# Patient Record
Sex: Male | Born: 1995 | Race: White | Hispanic: No | Marital: Single | Smoking: Never smoker
Health system: Southern US, Community
[De-identification: ages and names within clinical notes are randomized; demographics above are authoritative.]

## PROBLEM LIST (undated history)

## (undated) ENCOUNTER — Ambulatory Visit (HOSPITAL_COMMUNITY): Discharge status: Absent without leave | Payer: Self-pay

## (undated) HISTORY — PX: NO PAST SURGERIES: SHX2092

---

## 2015-12-02 ENCOUNTER — Encounter: Payer: Self-pay | Admitting: *Deleted

## 2015-12-02 ENCOUNTER — Ambulatory Visit (INDEPENDENT_AMBULATORY_CARE_PROVIDER_SITE_OTHER): Payer: Worker's Compensation

## 2015-12-02 ENCOUNTER — Ambulatory Visit
Admission: EM | Admit: 2015-12-02 | Discharge: 2015-12-02 | Disposition: A | Discharge status: Home / Self Care | Payer: Worker's Compensation | Attending: Family Medicine | Admitting: Family Medicine

## 2015-12-02 DIAGNOSIS — S100XXA Contusion of throat, initial encounter: Secondary | ICD-10-CM | POA: Diagnosis not present

## 2015-12-02 NOTE — ED Notes (Signed)
While at work pt was struck with a metal grid to his adam's apple / anterior neck. Pt vomited 3x following incident but denies dyspnea at anytime since injury. No obvious edema/deformity to area at present.

## 2015-12-02 NOTE — Discharge Instructions (Signed)

## 2015-12-02 NOTE — ED Provider Notes (Signed)
CSN: [id number]     Arrival date & time 12/02/15  1736 History   First MD Initiated Contact with Patient 12/02/15 1802     Chief Complaint  Patient presents with  . Neck Injury   (Consider location/radiation/quality/duration/timing/severity/associated sxs/prior Treatment) HPI  20 year old male who presents with into her throat pain after he was struck at work by metal grid Striking the right anterior larynx. Proxy 15 minutes later he states that he vomited 3 times area is feeling better at the present time said no bleeding. And no further vomiting. He has had no change in his voice and denies any respiratory changes or difficulty. O2 sats on room air 99%.      History reviewed. No pertinent past medical history. History reviewed. No pertinent past surgical history. Family History  Problem Relation Age of Onset  . Healthy Mother   . Healthy Father    Social History  Substance Use Topics  . Smoking status: Never Smoker   . Smokeless tobacco: None  . Alcohol Use: Yes    Review of Systems  Constitutional: Negative for fever, chills, activity change and fatigue.  Gastrointestinal: Positive for nausea and vomiting.  All other systems reviewed and are negative.   Allergies  Review of patient's allergies indicates no known allergies.  Home Medications   Prior to Admission medications   Not on File   Meds Ordered and Administered this Visit  Medications - No data to display  BP 110/66 mmHg  Pulse 56  Temp(Src) 98.4 F (36.9 C) (Oral)  Resp 16  Ht 5\' 11"  (1.803 m)  Wt 150 lb (68.04 kg)  BMI 20.93 kg/m2  SpO2 99% No data found.   Physical Exam  Constitutional: He is oriented to person, place, and time. He appears well-developed and well-nourished. No distress.  HENT:  Head: Normocephalic and atraumatic.  Eyes: Conjunctivae and EOM are normal. Pupils are equal, round, and reactive to light. Right eye exhibits no discharge. Left eye exhibits no discharge.  Neck:  Normal range of motion. Neck supple.  Examination of the cervical spine shows good range of motion that is comfortable. There is mild tenderness to palpation of the right lateral pharynx. He has a excellent inspiration and expiration with good air movement. There is no stridor. Is able to swallow without difficulty. The voice does not sound hoarse or muffled.  Pulmonary/Chest: Effort normal and breath sounds normal. No respiratory distress. He has no wheezes. He has no rales.  Musculoskeletal: Normal range of motion.  Lymphadenopathy:    He has no cervical adenopathy.  Neurological: He is alert and oriented to person, place, and time.  Skin: Skin is warm and dry. He is not diaphoretic.  Psychiatric: He has a normal mood and affect. His behavior is normal. Judgment and thought content normal.  Nursing note and vitals reviewed.   ED Course  Procedures (including critical care time)  Labs Review Labs Reviewed - No data to display  Imaging Review Dg Neck Soft Tissue  12/02/2015  CLINICAL DATA:  Patient struck with metal grid to anterior neck at work today. Vomiting 3 times after incident. Mild pain on swallowing. EXAM: NECK SOFT TISSUES - 1+ VIEW COMPARISON:  None. FINDINGS: There is no evidence of retropharyngeal soft tissue swelling or epiglottic enlargement. The cervical airway is unremarkable and no radio-opaque foreign body identified. IMPRESSION: Negative. Electronically Signed   By: Elberta Fortisaniel  Boyle M.D.   On: 12/02/2015 18:46     Visual Acuity Review  Right Eye  Distance:   Left Eye Distance:   Bilateral Distance:    Right Eye Near:   Left Eye Near:    Bilateral Near:         MDM   1. Contusion, throat, initial encounter    Have told patient that his x-rays were negative for any injury to the larynx by soft tissue study. I have recommended that he return to work tomorrow. He should have no restrictions. Over-the-counter ibuprofen if he is having discomfort. He develops any   Stridor, difficulty breathing or continued pain he should follow-up with [facility]. He may return of course to the clinic or go to emergency department.    Lutricia FeilWilliam P Zong Mcquarrie, PA-C 12/02/15 1907

## 2017-12-19 IMAGING — CR DG NECK SOFT TISSUE
2 series · 2 of 2 positions shown · non-contrast
Comparison: None.

CLINICAL DATA: Patient struck with metal grid to anterior neck at
work today. Vomiting 3 times after incident. Mild pain on
swallowing.

EXAM:
NECK SOFT TISSUES - 1+ VIEW

[neck lat]
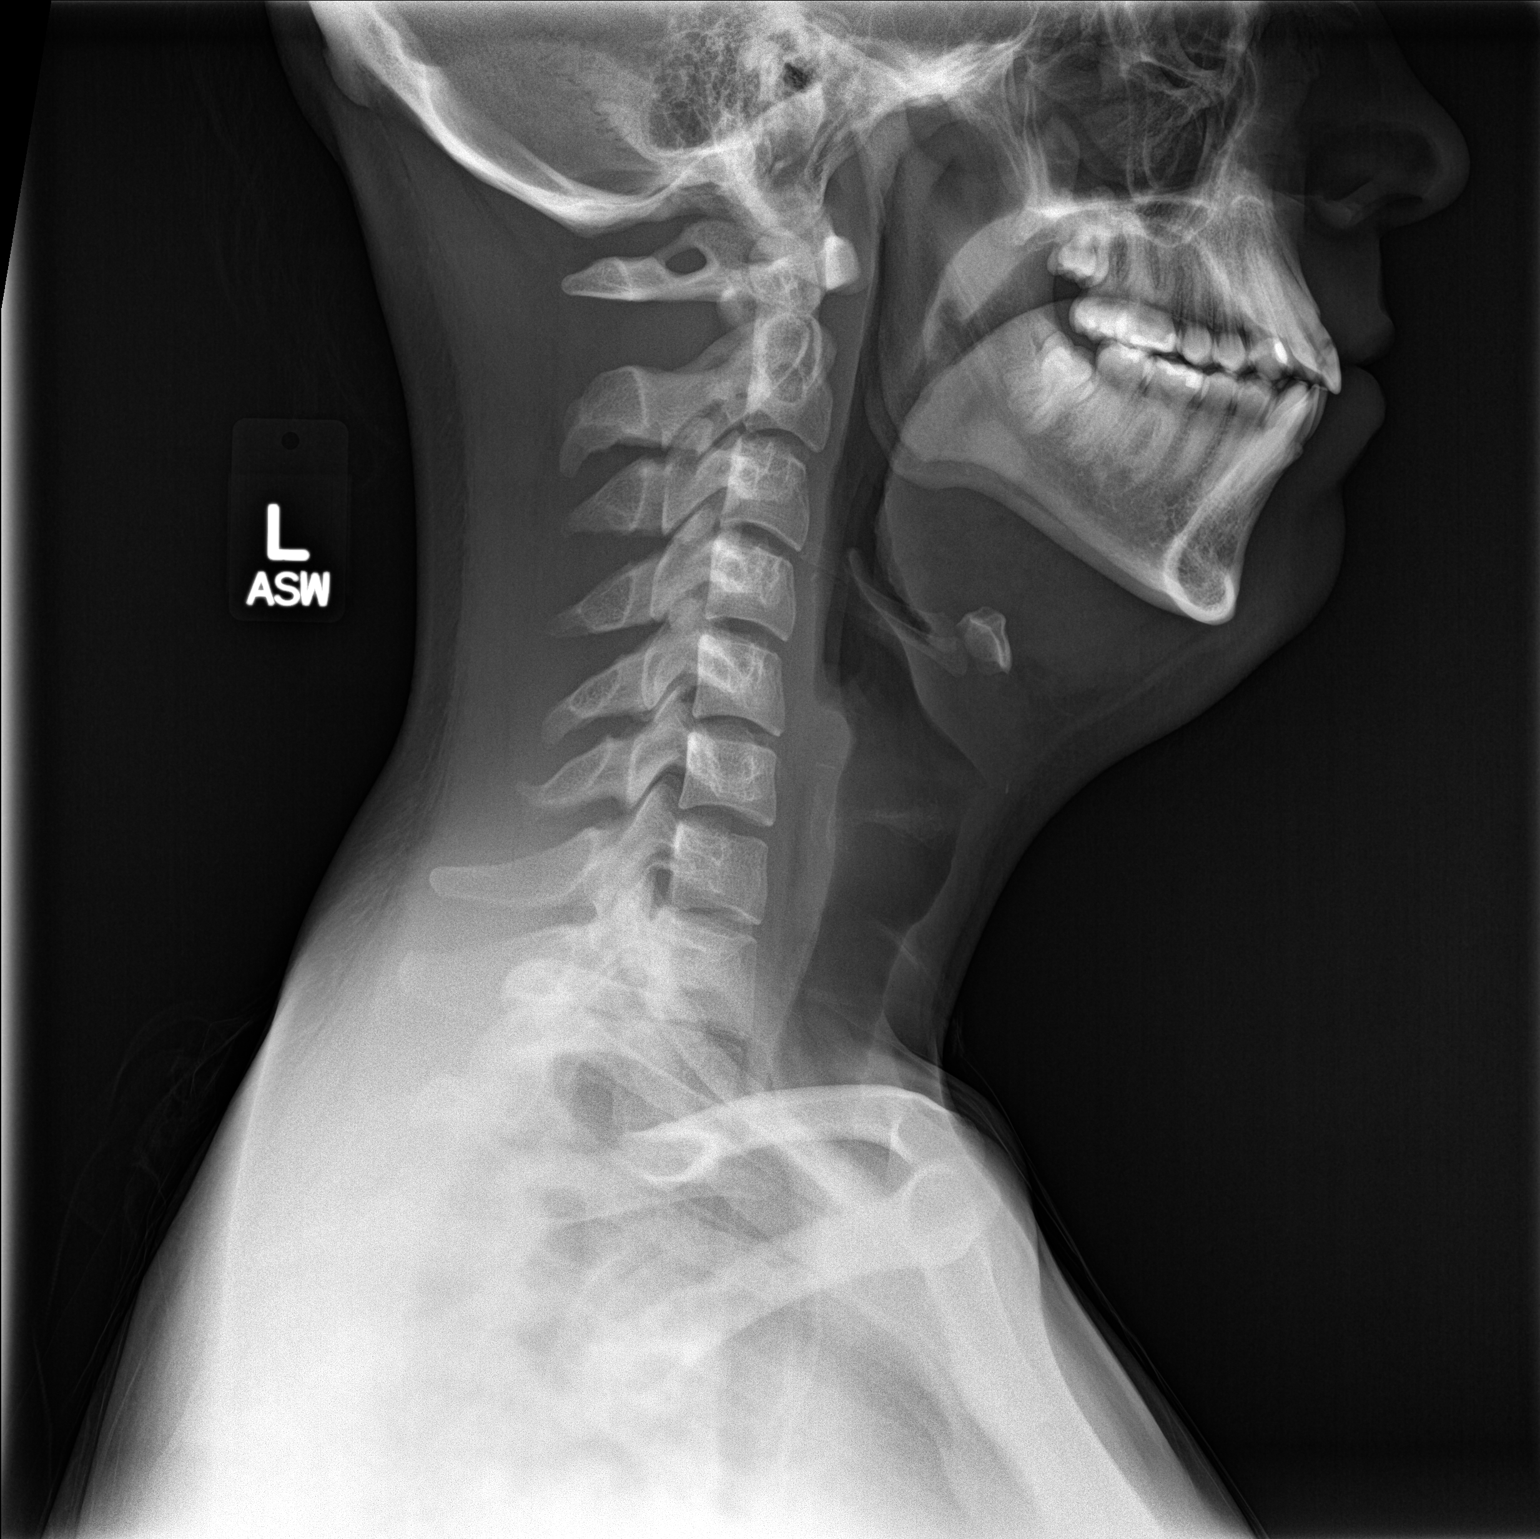

[neck ap]
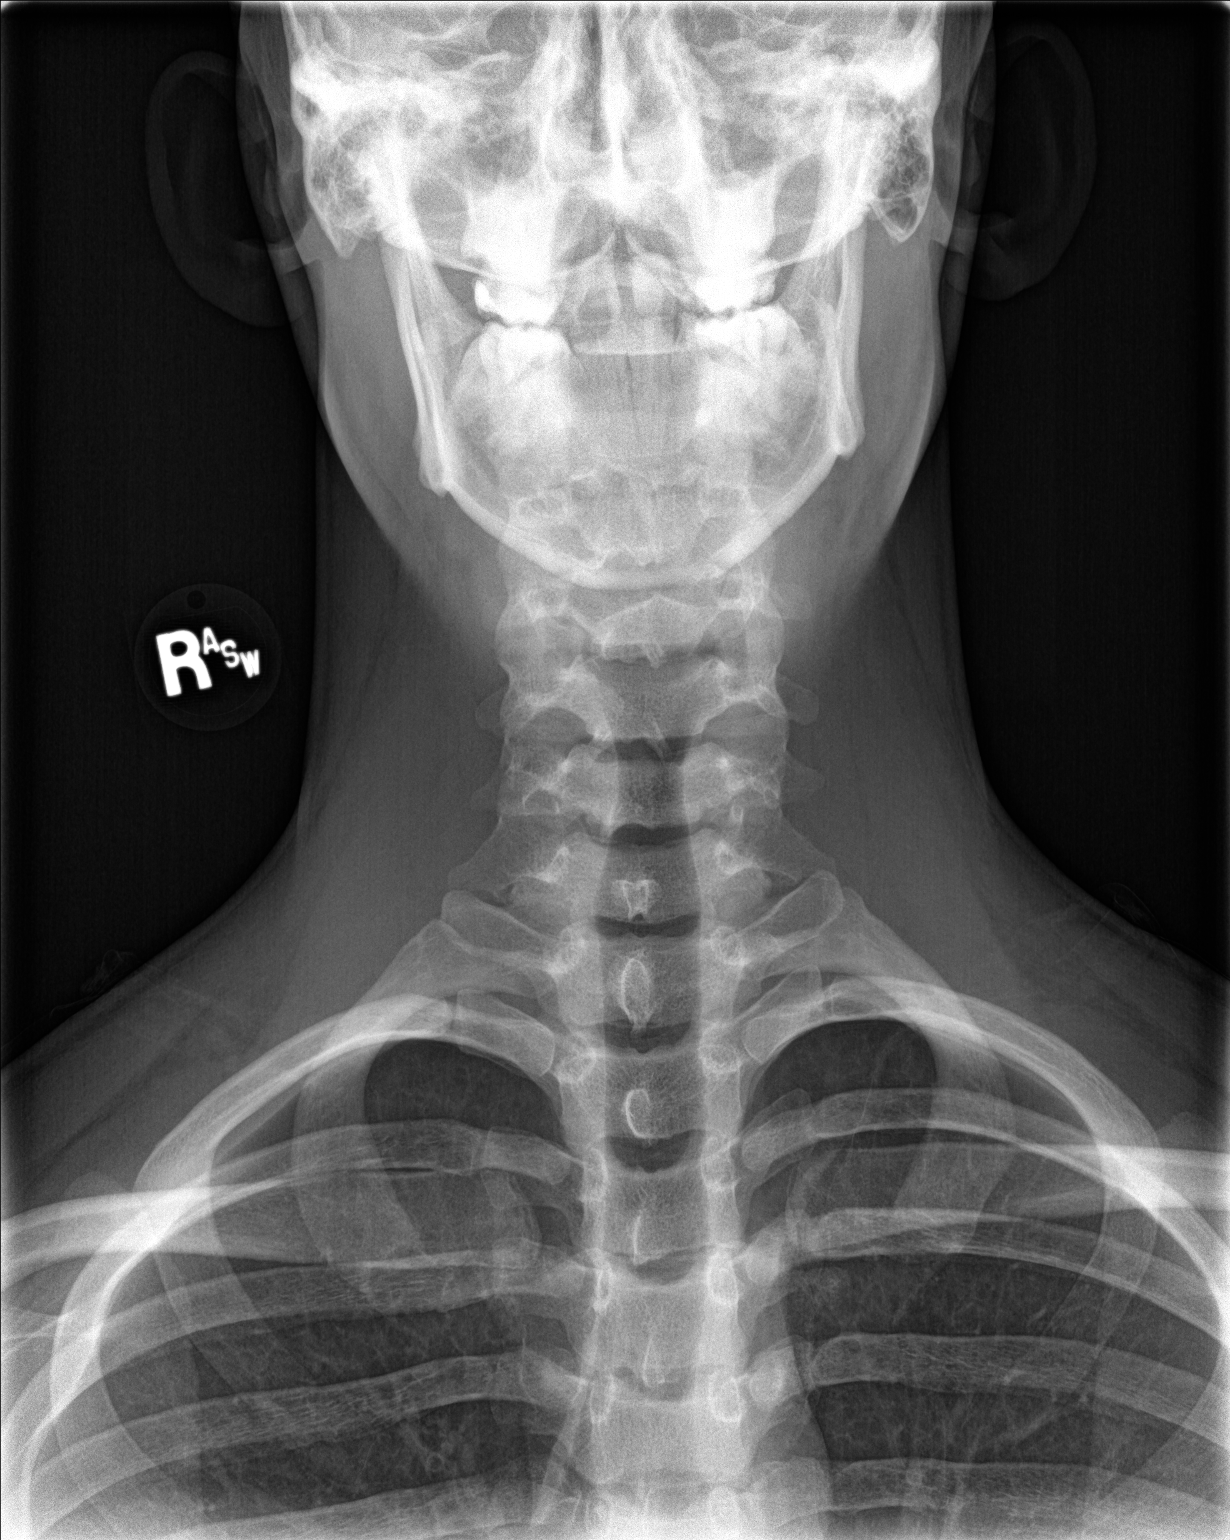

[2 of 2 positions shown; findings below may reference images not displayed]

FINDINGS: There is no evidence of retropharyngeal soft tissue swelling or
epiglottic enlargement. The cervical airway is unremarkable and no
radio-opaque foreign body identified.
IMPRESSION: Negative.

## 2018-12-17 ENCOUNTER — Other Ambulatory Visit: Payer: Self-pay

## 2018-12-17 ENCOUNTER — Ambulatory Visit
Admission: EM | Admit: 2018-12-17 | Discharge: 2018-12-17 | Disposition: A | Discharge status: Home / Self Care | Payer: Self-pay | Attending: Urgent Care | Admitting: Urgent Care

## 2018-12-17 ENCOUNTER — Encounter: Payer: Self-pay | Admitting: Emergency Medicine

## 2018-12-17 DIAGNOSIS — L309 Dermatitis, unspecified: Secondary | ICD-10-CM

## 2018-12-17 MED ORDER — TRIAMCINOLONE ACETONIDE 0.1 % EX CREA: 1.0000 "application " | TOPICAL_CREAM | Freq: Two times a day (BID) | CUTANEOUS | 0 refills | Status: AC

## 2018-12-17 NOTE — Discharge Instructions (Signed)
It was very nice seeing you today in clinic. Thank you for entrusting me with your care.   Make sure to keep skin moisturized with over the counter lotion. Apply steroid cream as needed. Limit use on face and try to avoid direct sun exposure following application. Consider dermatology evaluation.   Make arrangements to follow up with your regular doctor in 1 week for re-evaluation if not improving. If your symptoms/condition worsens, please seek follow up care either here or in the ER. Please remember, our [place] providers are "right here with you" when you need [place].   Again, it was my pleasure to take care of you today. Thank you for choosing our clinic. I hope that you start to feel better quickly.   Honor Loh, MSN, APRN, FNP-C, CEN Advanced Practice Provider [facility]

## 2018-12-17 NOTE — ED Provider Notes (Signed)
[place], [place]   Name: Justin Nunez DOB: [birth year] MRN: [id number] CSN: [id number] PCP: Patient, No Pcp Per  Arrival date and time:  12/17/18 1057  Chief Complaint:  Rash   NOTE: Prior to seeing the patient today, I have reviewed the triage nursing documentation and vital signs. Clinical staff has updated patient's PMH/PSHx, current medication list, and drug allergies/intolerances to ensure comprehensive history available to assist in medical decision making.   History:   HPI: Justin Nunez is a 23 y.o. male who presents today with complaints of a rash to his entire body that has been going on for since January. Patient advising that rash was diagnosed as eczema. The heat seems to make rash worse. Rash described as being pruritic all of the time with intermittent burning. Patient using over the counter moisturizing lotions to help improve his symptoms, however with the hot weather as of late, he notes that his skin condition has really been bothering him. Patient uses diphenhydramine from time to time when the itching becomes severe.   History reviewed. No pertinent past medical history.  Past Surgical History:  Procedure Laterality Date  . NO PAST SURGERIES      Family History  Problem Relation Age of Onset  . Healthy Mother   . Healthy Father     Social History   Tobacco Use  . Smoking status: Never Smoker  . Smokeless tobacco: Never Used  Substance Use Topics  . Alcohol use: Yes  . Drug use: Not Currently    There are no active problems to display for this patient.   Home Medications:    No outpatient medications have been marked as taking for the 12/17/18 encounter [facility] Encounter).    Allergies:   Patient has no known allergies.  Review of Systems (ROS): Review of Systems  Constitutional: Negative for chills and fever.  Respiratory: Negative for cough and shortness of breath.   Cardiovascular: Negative for chest pain and palpitations.  Skin: Positive for  color change and rash.     Vital Signs: Today's Vitals   12/17/18 1120 12/17/18 1123 12/17/18 1157  BP:  125/84   Pulse:  67   Resp:  18   Temp:  97.6 F (36.4 C)   TempSrc:  Oral   SpO2:  98%   Weight: 192 lb (87.1 kg)    Height: 5\' 10"  (1.778 m)    PainSc: 0-No pain  0-No pain    Physical Exam: Physical Exam  Constitutional: He is oriented to person, place, and time and well-developed, well-nourished, and in no distress.  HENT:  Head: Normocephalic and atraumatic.  Mouth/Throat: Mucous membranes are normal.  Eyes: Pupils are equal, round, and reactive to light. EOM are normal.  Neck: Normal range of motion. No tracheal deviation present.  Cardiovascular: Normal rate, regular rhythm, normal heart sounds and intact distal pulses. Exam reveals no gallop and no friction rub.  No murmur heard. Pulmonary/Chest: Effort normal and breath sounds normal. No respiratory distress. He has no wheezes. He has no rales.  Neurological: He is alert and oriented to person, place, and time. Gait normal. GCS score is 15.  Skin: Skin is warm and dry. Rash (eczematous; face, arms and legs. Areas of confluence noted in BILATERAL antecubital areas) noted.  Psychiatric: Mood, memory, affect and judgment normal.  Nursing note and vitals reviewed.   Urgent Care Treatments / Results:   LABS: PLEASE NOTE: all labs that were ordered this encounter are listed, however only abnormal results are  displayed. Labs Reviewed - No data to display  EKG: -None  RADIOLOGY: No results found.  PROCEDURES: Procedures  MEDICATIONS RECEIVED THIS VISIT: Medications - No data to display  PERTINENT CLINICAL COURSE NOTES/UPDATES:   Initial Impression / Assessment and Plan / Urgent Care Course:  Pertinent labs & imaging results that were available during my care of the patient were personally reviewed by me and considered in my medical decision making (see lab/imaging section of note for values and  interpretations).  Justin Nunez is a 23 y.o. male who [facility] with complaints of Rash   Patient overall well appearing and in no acute distress today in clinic. Exam reveals diffusely distributed eczematous rash to face, arms, and legs. Areas of confluences noted to antecubital areas. PMH (+) for diagnosed eczema. He is using moisturizing lotion to help with his skin condition, however notes exacerbations with increased heat. Will prescribe topical TAC to help reduce inflammation and erythema. Advised to exercise caution with topical cream and direct sun exposure, especially on the face. If intervention is not effective, discussed referral to dermatology for further ongoing management.   Discussed follow up with primary care physician in 1 week for re-evaluation. I have reviewed the follow up and strict return precautions for any new or worsening symptoms. Patient is aware of symptoms that would be deemed urgent/emergent, and would thus require further evaluation either here or in the emergency department. At the time of discharge, he verbalized understanding and consent with the discharge plan as it was reviewed with him. All questions were fielded by provider and/or clinic staff prior to patient discharge.    Final Clinical Impressions / Urgent Care Diagnoses:   Final diagnoses:  Eczema, unspecified type    New Prescriptions:  [name] Controlled Substance Registry consulted? Not Applicable  Meds ordered this encounter  Medications  . triamcinolone cream (KENALOG) 0.1 %    Sig: Apply 1 application topically 2 (two) times daily.    Dispense:  453.6 g    Refill:  0    Recommended Follow up Care:  Patient encouraged to follow up with the following provider within the specified time frame, or sooner as dictated by the severity of his symptoms. As always, he was instructed that for any urgent/emergent care needs, he should seek care either here or in the emergency  department for more immediate evaluation. Follow-up Information    PCP In 1 week.   Why: General reassessment of symptoms if not improving         NOTE: This note was prepared using Scientist, clinical (histocompatibility and immunogenetics)Dragon dictation software along with smaller Lobbyistphrase technology. Despite my best ability to proofread, there is the potential that transcriptional errors may still occur from this process, and are completely unintentional.     Verlee MonteGray, [place] Artola E, NP 12/18/18 2238

## 2018-12-17 NOTE — ED Triage Notes (Signed)
Pt c/o rash on his upper body. He states that it is heat rash and he gets this often. Sometimes it itches ans stings. He states that it started about 6 months ago.
